# Patient Record
Sex: Female | Born: 1980 | Race: White | Hispanic: No | Smoking: Current every day smoker
Health system: Southern US, Community
[De-identification: ages and names within clinical notes are randomized; demographics above are authoritative.]

---

## 2016-10-04 ENCOUNTER — Emergency Department (HOSPITAL_COMMUNITY)
Admission: EM | Admit: 2016-10-04 | Discharge: 2016-10-05 | Disposition: A | Discharge status: Home / Self Care | Payer: Self-pay | Attending: Emergency Medicine | Admitting: Emergency Medicine

## 2016-10-04 ENCOUNTER — Encounter (HOSPITAL_COMMUNITY): Payer: Self-pay | Admitting: Emergency Medicine

## 2016-10-04 ENCOUNTER — Emergency Department (HOSPITAL_COMMUNITY): Payer: Self-pay

## 2016-10-04 DIAGNOSIS — F17200 Nicotine dependence, unspecified, uncomplicated: Secondary | ICD-10-CM | POA: Insufficient documentation

## 2016-10-04 DIAGNOSIS — E876 Hypokalemia: Secondary | ICD-10-CM | POA: Insufficient documentation

## 2016-10-04 DIAGNOSIS — J40 Bronchitis, not specified as acute or chronic: Secondary | ICD-10-CM | POA: Insufficient documentation

## 2016-10-04 DIAGNOSIS — R042 Hemoptysis: Secondary | ICD-10-CM | POA: Insufficient documentation

## 2016-10-04 LAB — CBC
HEMATOCRIT: 35.1 % — AB (ref 36.0–46.0)
Hemoglobin: 12.2 g/dL (ref 12.0–15.0)
MCH: 31.1 pg (ref 26.0–34.0)
MCHC: 34.8 g/dL (ref 30.0–36.0)
MCV: 89.5 fL (ref 78.0–100.0)
Platelets: 242 10*3/uL (ref 150–400)
RBC: 3.92 MIL/uL (ref 3.87–5.11)
RDW: 12.6 % (ref 11.5–15.5)
WBC: 9 10*3/uL (ref 4.0–10.5)

## 2016-10-04 NOTE — ED Triage Notes (Addendum)
Pt states she started coughing up blood yesterday and denies any injury or pain at this time. Pt denies any sob. Pt is currently coughing up blood in triage

## 2016-10-04 NOTE — ED Provider Notes (Addendum)
[facility]-EMERGENCY DEPT Provider Note   CSN: [id number] Arrival date & time: 10/04/16  2113     History   Chief Complaint Chief Complaint  Patient presents with  . Hemoptysis    HPI Karina Jackson is a 36 y.o. female.  Patient presents to the emergency department for evaluation of hemoptysis. Patient reports that symptoms have been ongoing for 2 or 3 days. She reports that she first noticed blood tinged saliva when brushing her teeth several days ago. After that she noticed that she was starting to have a cough and mucus has been mixed with bright red blood. She denies any chest pain. She has not had any shortness of breath. There is no fever. Patient denies history of blood clot, no leg swelling.      History reviewed. No pertinent past medical history.  There are no active problems to display for this patient.   History reviewed. No pertinent surgical history.  OB History    No data available       Home Medications    Prior to Admission medications   Medication Sig Start Date End Date Taking? Authorizing Provider  amoxicillin (AMOXIL) 500 MG capsule Take 2 capsules (1,000 mg total) by mouth 2 (two) times daily. 10/05/16   Gilda Crease, MD  predniSONE (DELTASONE) 20 MG tablet Take 2 tablets (40 mg total) by mouth daily with breakfast. 10/05/16   Tyrome Donatelli, Canary Brim, MD    Family History History reviewed. No pertinent family history.  Social History Social History  Substance Use Topics  . Smoking status: Current Every Day Smoker  . Smokeless tobacco: Never Used  . Alcohol use No     Allergies   Patient has no allergy information on record.   Review of Systems Review of Systems  Constitutional: Negative for chills and fever.  HENT: Negative for ear pain and sore throat.   Eyes: Negative for pain and visual disturbance.  Respiratory: Negative for cough and shortness of breath.   Cardiovascular: Negative for chest pain and palpitations.    Gastrointestinal: Negative for abdominal pain and vomiting.  Genitourinary: Negative for dysuria and hematuria.  Musculoskeletal: Negative for arthralgias and back pain.  Skin: Negative for color change and rash.  Neurological: Negative for seizures and syncope.  All other systems reviewed and are negative.    Physical Exam Updated Vital Signs BP 117/84 (BP Location: Right Arm)   Pulse 74   Temp 98.5 F (36.9 C) (Oral)   Resp 16   Ht 5\' 4"  (1.626 m)   Wt 52.2 kg (115 lb)   LMP 09/28/2016   SpO2 98%   BMI 19.74 kg/m   Physical Exam  Constitutional: She is oriented to person, place, and time. She appears well-developed and well-nourished. No distress.  HENT:  Head: Normocephalic and atraumatic.  Right Ear: Hearing normal.  Left Ear: Hearing normal.  Nose: Nose normal.  Mouth/Throat: Oropharynx is clear and moist and mucous membranes are normal.  Eyes: Pupils are equal, round, and reactive to light. Conjunctivae and EOM are normal.  Neck: Normal range of motion. Neck supple.  Cardiovascular: Regular rhythm, S1 normal and S2 normal.  Exam reveals no gallop and no friction rub.   No murmur heard. Pulmonary/Chest: Effort normal and breath sounds normal. No respiratory distress. She exhibits no tenderness.  Abdominal: Soft. Normal appearance and bowel sounds are normal. There is no hepatosplenomegaly. There is no tenderness. There is no rebound, no guarding, no tenderness at McBurney's point and negative Murphy's  sign. No hernia.  Musculoskeletal: Normal range of motion.  Neurological: She is alert and oriented to person, place, and time. She has normal strength. No cranial nerve deficit or sensory deficit. Coordination normal. GCS eye subscore is 4. GCS verbal subscore is 5. GCS motor subscore is 6.  Skin: Skin is warm, dry and intact. No rash noted. No cyanosis.  Psychiatric: She has a normal mood and affect. Her speech is normal and behavior is normal. Thought content normal.   Nursing note and vitals reviewed.    ED Treatments / Results  Labs (all labs ordered are listed, but only abnormal results are displayed) Labs Reviewed  COMPREHENSIVE METABOLIC PANEL - Abnormal; Notable for the following:       Result Value   Potassium 2.8 (*)    ALT 11 (*)    All other components within normal limits  CBC - Abnormal; Notable for the following:    HCT 35.1 (*)    All other components within normal limits  D-DIMER, QUANTITATIVE (NOT AT [facility])  TYPE AND SCREEN    EKG  EKG Interpretation None       Radiology Dg Chest 2 View  Result Date: 10/04/2016 CLINICAL DATA:  Cough.  Blood pooling in throat. EXAM: CHEST  2 VIEW COMPARISON:  None. FINDINGS: Hyperinflation of the lungs is identified. No pneumothorax. No nodules or masses. No focal infiltrates. The cardiomediastinal silhouette is normal. No other acute abnormalities are identified. IMPRESSION: Hyperinflation of the lungs.  No other acute abnormalities. Electronically Signed   By: Gerome Sam III M.D   On: 10/04/2016 21:56    Procedures Procedures (including critical care time)  Medications Ordered in ED Medications  potassium chloride SA (K-DUR,KLOR-CON) CR tablet 40 mEq (not administered)     Initial Impression / Assessment and Plan / ED Course  I have reviewed the triage vital signs and the nursing notes.  Pertinent labs & imaging results that were available during my care of the patient were reviewed by me and considered in my medical decision making (see chart for details).     Patient presents to the emergency department for evaluation of hemoptysis. Patient is experiencing cough that is productive of sputum is blood-tinged. There is no associated chest pain. She is not short of breath. Vital signs are normal, no hypoxia, tachypnea, tachycardia. Chest x-ray is clear, no evidence of pneumonia or other abnormality. Discussed with patient possibility of PE. She did not wish to undergo CT  angiography because of the cost. D-dimer was therefore performed and it is negative. Based on her vital signs, presentation, lack of pain and shortness of breath, normal d-dimer, PE is felt to be very unlikely. Will treat for bronchitis, follow-up with primary care doctor.  Final Clinical Impressions(s) / ED Diagnoses   Final diagnoses:  Hemoptysis  Bronchitis  Hypokalemia    New Prescriptions New Prescriptions   AMOXICILLIN (AMOXIL) 500 MG CAPSULE    Take 2 capsules (1,000 mg total) by mouth 2 (two) times daily.   PREDNISONE (DELTASONE) 20 MG TABLET    Take 2 tablets (40 mg total) by mouth daily with breakfast.     Gilda Crease, MD 10/05/16 0037    Gilda Crease, MD 10/20/16 [phone]

## 2016-10-05 LAB — COMPREHENSIVE METABOLIC PANEL
ALK PHOS: 42 U/L (ref 38–126)
ALT: 11 U/L — AB (ref 14–54)
AST: 15 U/L (ref 15–41)
Albumin: 4.3 g/dL (ref 3.5–5.0)
Anion gap: 9 (ref 5–15)
BUN: 12 mg/dL (ref 6–20)
CALCIUM: 9.1 mg/dL (ref 8.9–10.3)
CO2: 25 mmol/L (ref 22–32)
CREATININE: 0.78 mg/dL (ref 0.44–1.00)
Chloride: 102 mmol/L (ref 101–111)
GFR calc non Af Amer: >60 mL/min (ref >60)
GLUCOSE: 98 mg/dL (ref 65–99)
Potassium: 2.8 mmol/L — ABNORMAL LOW (ref 3.5–5.1)
SODIUM: 136 mmol/L (ref 135–145)
Total Bilirubin: 0.8 mg/dL (ref 0.3–1.2)
Total Protein: 7.3 g/dL (ref 6.5–8.1)

## 2016-10-05 LAB — TYPE AND SCREEN
ABO/RH(D): A NEG
Antibody Screen: NEGATIVE

## 2016-10-05 LAB — D-DIMER, QUANTITATIVE: D-Dimer, Quant: <0.27 ug/mL-FEU (ref 0.00–0.50)

## 2016-10-05 MED ORDER — AMOXICILLIN 500 MG PO CAPS: 1000.0000 mg | ORAL_CAPSULE | Freq: Two times a day (BID) | ORAL | 0 refills | Status: AC

## 2016-10-05 MED ORDER — POTASSIUM CHLORIDE CRYS ER 20 MEQ PO TBCR
40.0000 meq | EXTENDED_RELEASE_TABLET | Freq: Once | ORAL | Status: AC
  Administered 2016-10-05: 40 meq via ORAL
  Filled 2016-10-05: qty 2

## 2016-10-05 MED ORDER — PREDNISONE 20 MG PO TABS: 40.0000 mg | ORAL_TABLET | Freq: Every day | ORAL | 0 refills | Status: AC

## 2018-10-26 IMAGING — DX DG CHEST 2V
2 series · 2 of 2 positions shown · non-contrast
Comparison: None.

CLINICAL DATA: Cough.  Blood pooling in throat.

EXAM:
CHEST  2 VIEW

[chest pa]
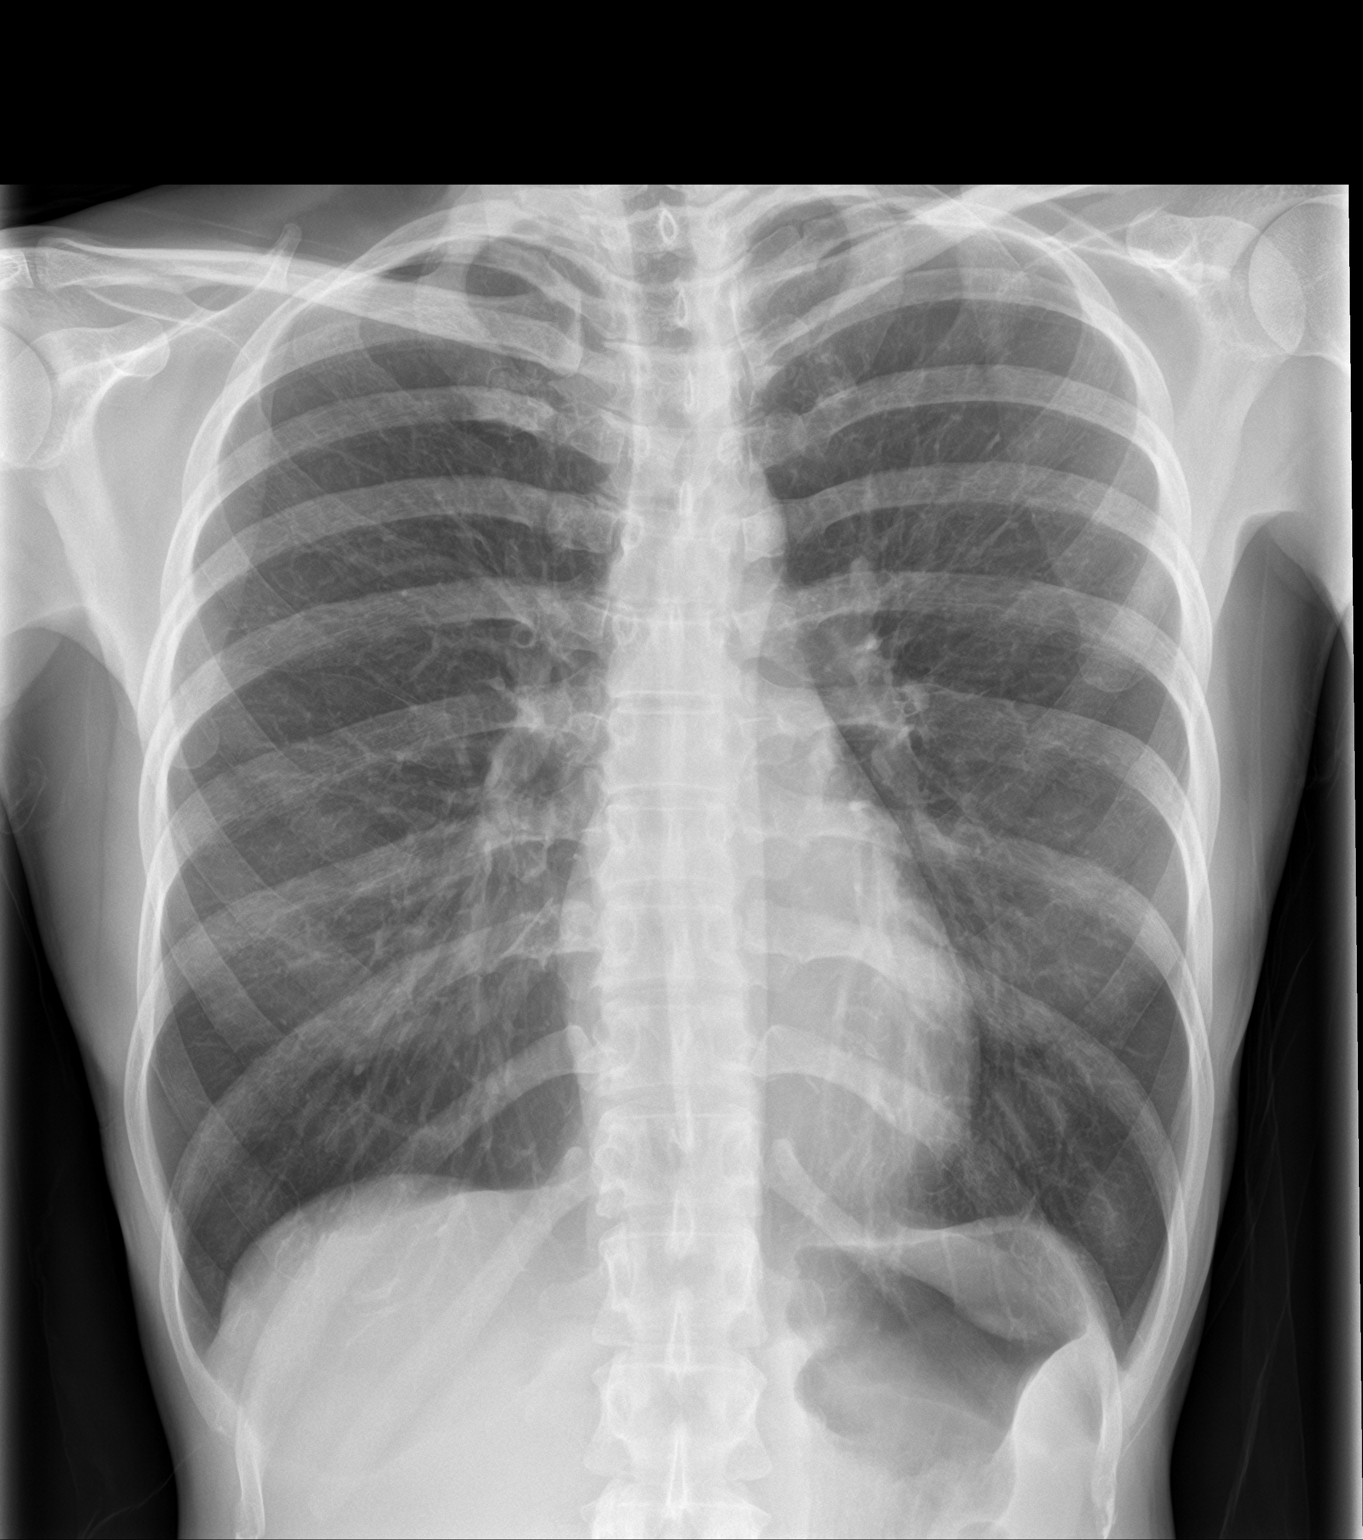

[chest lat]
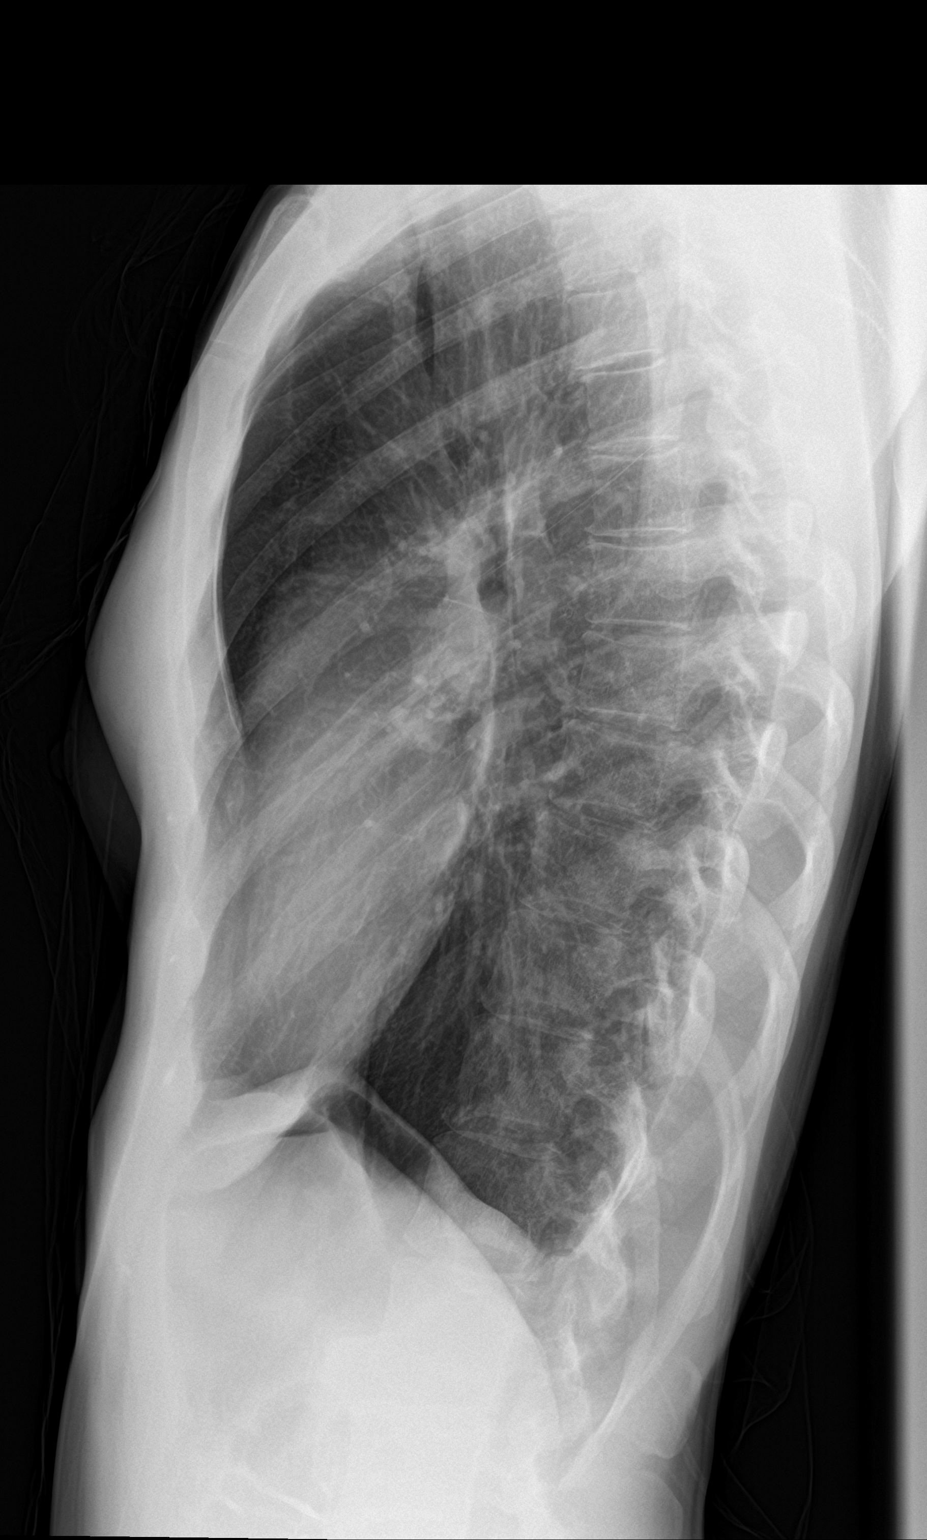

[2 of 2 positions shown; findings below may reference images not displayed]

FINDINGS: Hyperinflation of the lungs is identified. No pneumothorax. No
nodules or masses. No focal infiltrates. The cardiomediastinal
silhouette is normal. No other acute abnormalities are identified.
IMPRESSION: Hyperinflation of the lungs.  No other acute abnormalities.
